# Patient Record
Sex: Female | Born: 1965 | Race: Black or African American | Hispanic: No | Marital: Single | Smoking: Never smoker
Health system: Southern US, Community
[De-identification: ages and names within clinical notes are randomized; demographics above are authoritative.]

## PROBLEM LIST (undated history)

## (undated) DIAGNOSIS — E890 Postprocedural hypothyroidism: Secondary | ICD-10-CM

## (undated) HISTORY — PX: CHOLECYSTECTOMY: SHX55

## (undated) HISTORY — DX: Postprocedural hypothyroidism: E89.0

## (undated) HISTORY — PX: TUBAL LIGATION: SHX77

## (undated) HISTORY — PX: APPENDECTOMY: SHX54

## (undated) HISTORY — PX: TONSILLECTOMY: SUR1361

---

## 2007-10-16 HISTORY — PX: CHOLECYSTECTOMY: SHX55

## 2017-10-11 ENCOUNTER — Other Ambulatory Visit: Payer: Self-pay | Admitting: Family Medicine

## 2017-10-11 ENCOUNTER — Other Ambulatory Visit: Payer: Self-pay

## 2017-10-11 DIAGNOSIS — M7989 Other specified soft tissue disorders: Secondary | ICD-10-CM

## 2019-04-23 ENCOUNTER — Other Ambulatory Visit: Payer: Self-pay | Admitting: Family Medicine

## 2019-04-23 DIAGNOSIS — M79605 Pain in left leg: Secondary | ICD-10-CM

## 2019-04-23 DIAGNOSIS — I83812 Varicose veins of left lower extremities with pain: Secondary | ICD-10-CM

## 2019-05-21 ENCOUNTER — Ambulatory Visit
Admission: RE | Admit: 2019-05-21 | Discharge: 2019-05-21 | Disposition: A | Discharge status: Home / Self Care | Payer: BC Managed Care – PPO | Source: Ambulatory Visit | Attending: Family Medicine | Admitting: Family Medicine

## 2019-05-21 DIAGNOSIS — I83812 Varicose veins of left lower extremities with pain: Secondary | ICD-10-CM

## 2019-05-21 NOTE — Consult Note (Signed)
Chief Complaint:  Left lower extremity pain, concern for venous insufficiency and varicosities  Referring Physician(s): [place]  History of Present Illness: Katelyn Montgomery is a 53 y.o. female female with recent left calf bruising and swelling with concern for varicosities.  She has had no prior treatments for varicose veins.  No history of chronic venous disease, skin lesions or ulcerations.  No prior DVT.  She does have a family history of varicose veins affecting her sister and mother.  She does not currently wear support stockings or prescription strength compression stockings.  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  The symptoms were exacerbated by recent move with heavy lifting.  She does get some alleviation with leg elevation.  She reports minor scattered spider veins.  She currently does not take any pain medicines.  No past medical history on file.    Allergies: Patient has no allergy information on record.  Medications: Prior to Admission medications   Not on File     No family history on file.  Social History   Socioeconomic History  . Marital status: Unknown    Spouse name: Not on file  . Number of children: Not on file  . Years of education: Not on file  . Highest education level: Not on file  Occupational History  . Not on file  Social Needs  . Financial resource strain: Not on file  . Food insecurity    Worry: Not on file    Inability: Not on file  . Transportation needs    Medical: Not on file    Non-medical: Not on file  Tobacco Use  . Smoking status: Not on file  Substance and Sexual Activity  . Alcohol use: Not on file  . Drug use: Not on file  . Sexual activity: Not on file  Lifestyle  . Physical activity    Days per week: Not on file    Minutes per session: Not on file  . Stress: Not on file  Relationships  . Social Herbalist on phone: Not on file    Gets together: Not on file     Attends religious service: Not on file    Active member of club or organization: Not on file    Attends meetings of clubs or organizations: Not on file    Relationship status: Not on file  Other Topics Concern  . Not on file  Social History Narrative  . Not on file     Review of Systems: A 12 point ROS discussed and pertinent positives are indicated in the HPI above.  All other systems are negative.  Review of Systems  Vital Signs: BP 127/87 (BP Location: Right Arm)   Pulse 71   Temp 98.4 F (36.9 C)   SpO2 99%   Physical Exam Constitutional:      General: She is not in acute distress.    Appearance: She is not ill-appearing.  Musculoskeletal: Normal range of motion.        General: No swelling, tenderness or deformity.     Left lower leg: No edema.     Comments: Left calf region demonstrates a few very minor superficial spider veins.  Similar spider veins in the thigh region anteriorly.  No significant varicosities upon inspection or palpation.  No peripheral edema.  Normal pedal pulses.  No signs of significant vascular disease.  Neurological:     Mental Status: She is alert.  Imaging: US Venous Img Lower Unilateral Left  Result Date: 05/21/2019 CLINICAL DATA:  Left lower extremity calf pain concern for venous insufficiency and varicosities EXAM: LEFT LOWER EXTREMITY VENOUS DOPPLER ULTRASOUND TECHNIQUE: Gray-scale sonography with graded compression, as well as color Doppler and duplex ultrasound were performed to evaluate the lower extremity deep venous systems from the level of the common femoral vein and including the common femoral, femoral, profunda femoral, popliteal and calf veins including the posterior tibial, peroneal and gastrocnemius veins when visible. The superficial great saphenous vein was also interrogated. Spectral Doppler was utilized to evaluate flow at rest and with distal augmentation maneuvers in the common femoral, femoral and popliteal veins.  COMPARISON:  None. FINDINGS: Contralateral Common Femoral Vein: Respiratory phasicity is normal and symmetric with the symptomatic side. No evidence of thrombus. Normal compressibility. Common Femoral Vein: No evidence of thrombus. Normal compressibility, respiratory phasicity and response to augmentation. Saphenofemoral Junction: No evidence of thrombus. Normal compressibility and flow on color Doppler imaging. Negative for venous insufficiency or reflux. Profunda Femoral Vein: No evidence of thrombus. Normal compressibility and flow on color Doppler imaging. Femoral Vein: No evidence of thrombus. Normal compressibility, respiratory phasicity and response to augmentation. Popliteal Vein: No evidence of thrombus. Normal compressibility, respiratory phasicity and response to augmentation. Calf Veins: No evidence of thrombus. Normal compressibility and flow on color Doppler imaging. Superficial Great Saphenous Vein: No evidence of thrombus. Normal compressibility. Negative for venous insufficiency or reflux. Small saphenous vein: Negative for venous insufficiency or reflux. Venous Reflux:  None. Other Findings:  None. IMPRESSION: Negative for significant saphenous venous insufficiency or reflux. No underlying sub surface lower extremity varicosities by ultrasound. Negative for DVT. Electronically Signed   By: Jerilynn Mages.  Logan Vegh M.D.   On: 05/21/2019 09:57   [place] Rad Eval And Mgmt  Result Date: 05/21/2019 Please refer to "Notes" to see consult details.   Labs:  CBC: No results for input(s): WBC, HGB, HCT, PLT in the last 8760 hours.  COAGS: No results for input(s): INR, APTT in the last 8760 hours.  BMP: No results for input(s): NA, K, CL, CO2, GLUCOSE, BUN, CALCIUM, CREATININE, GFRNONAA, GFRAA in the last 8760 hours.  Invalid input(s): CMP  LIVER FUNCTION TESTS: No results for input(s): BILITOT, AST, ALT, ALKPHOS, PROT, ALBUMIN in the last 8760 hours.   Assessment and Plan:  Improving left lower  extremity calf region pain with a few scattered spider veins.  Suspect recent mild musculoskeletal injury.  Ultrasound is negative for DVT or saphenous venous insufficiency.  No subsurface varicosities.  Findings were reviewed with the patient.  She understands she does not need any laser treatment, vein removal, or injection.  All questions were addressed.  Plan: Follow-up as needed for any developing significant varicosities.  Electronically Signed: Greggory Keen 05/21/2019, 10:08 AM   I spent a total of  30 Minutes   in face to face in clinical consultation, greater than 50% of which was counseling/coordinating care for this patient with left calf pain and concern for varicosities

## 2019-07-02 ENCOUNTER — Other Ambulatory Visit: Payer: Self-pay

## 2019-07-02 ENCOUNTER — Encounter: Payer: Self-pay | Admitting: Internal Medicine

## 2019-07-02 ENCOUNTER — Ambulatory Visit (INDEPENDENT_AMBULATORY_CARE_PROVIDER_SITE_OTHER): Payer: BC Managed Care – PPO | Admitting: Internal Medicine

## 2019-07-02 DIAGNOSIS — E213 Hyperparathyroidism, unspecified: Secondary | ICD-10-CM | POA: Diagnosis not present

## 2019-07-02 LAB — ALBUMIN: Albumin: 4.3 g/dL (ref 3.5–5.2)

## 2019-07-02 LAB — BASIC METABOLIC PANEL
BUN: 10 mg/dL (ref 6–23)
CO2: 31 mEq/L (ref 19–32)
Calcium: 11.4 mg/dL — ABNORMAL HIGH (ref 8.4–10.5)
Chloride: 107 mEq/L (ref 96–112)
Creatinine, Ser: 0.67 mg/dL (ref 0.40–1.20)
GFR: 110.39 mL/min (ref >60.00)
Glucose, Bld: 95 mg/dL (ref 70–99)
Potassium: 4.2 mEq/L (ref 3.5–5.1)
Sodium: 142 mEq/L (ref 135–145)

## 2019-07-02 LAB — VITAMIN D 25 HYDROXY (VIT D DEFICIENCY, FRACTURES): VITD: 24.07 ng/mL — ABNORMAL LOW (ref 30.00–100.00)

## 2019-07-02 NOTE — Progress Notes (Signed)
Name: Katelyn Montgomery  MRN/ DOB: [id number], 1966/03/31    Age/ Sex: 53 y.o., female    PCP: Marda Stalker, PA-C   Reason for Endocrinology Evaluation: Hypercalcemia      Date of Initial Endocrinology Evaluation: 07/02/2019     HPI: Katelyn Montgomery is a 53 y.o. 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  The patient presented for initial endocrinology clinic visit on 07/02/2019 for consultative assistance with her hypercalcemia   Katelyn Montgomery indicates that she was first diagnosed with hypercalcemia in 2020. Since that time, she does not experienced symptoms of constipation, polyuria, polydipsia, generalized weakness, diffuse muscle pains, significant memory impairment. She denies use of over the counter calcium (including supplements, Tums, Rolaids, or other calcium containing antacids), lithium, HCTZ, or  She is on  vitamin D supplements 1000 iu   She denies  history of kidney stones, kidney disease, liver disease, granulomatous disease. She denies osteoporosis or prior fractures. Daily dietary calcium intake: 1 servings a week  . She denies family history of osteoporosis, parathyroid disease.    Two aunts with thyroid disease   She works at the [employer]:  Past Medical History:  Past Medical History:  Diagnosis Date  . Postablative hypothyroidism     Past Surgical History: The histories are not reviewed yet. Please review them in the "History" navigator section and refresh this [place].   Social History:  reports that she has never smoked. She has never used smokeless tobacco. She reports that she does not drink alcohol or use drugs.  Family History: family history includes Aneurysm in her father; Lung cancer in her mother.   HOME MEDICATIONS: Allergies as of 07/02/2019      Reactions   Indomethacin Rash      Medication List       Accurate as of July 02, 2019  2:54 PM. If you have any  questions, ask your nurse or doctor.        levothyroxine 75 MCG tablet Commonly known as: SYNTHROID Take 75 mcg by mouth daily before breakfast.   Vitamin D-3 25 MCG (1000 UT) Caps Take by mouth.         REVIEW OF SYSTEMS: A comprehensive ROS was conducted with the patient and is negative except as per HPI and below:  Review of Systems  Constitutional: Positive for malaise/fatigue. Negative for weight loss.  HENT: Negative for congestion and sore throat.   Eyes: Negative for blurred vision.  Respiratory: Negative for cough and shortness of breath.   Cardiovascular: Negative for chest pain and palpitations.  Gastrointestinal: Negative for constipation and nausea.  Genitourinary: Negative for frequency.  Neurological: Negative for tingling and tremors.  Endo/Heme/Allergies: Negative for polydipsia.  Psychiatric/Behavioral: Negative for depression. The patient is not nervous/anxious.        OBJECTIVE:  VS: BP 118/78 (BP Location: Left Arm, Patient Position: Sitting, Cuff Size: Normal)   Pulse 67   Temp 98.3 F (36.8 C)   Ht '5\' 3"'  (1.6 m)   Wt 157 lb 6.4 oz (71.4 kg)   SpO2 97%   BMI 27.88 kg/m    Wt Readings from Last 3 Encounters:  07/02/19 157 lb 6.4 oz (71.4 kg)     EXAM: General: Pt appears well and is in NAD  Hydration: Well-hydrated with moist mucous membranes and good skin turgor  Eyes: External eye exam normal without stare, lid lag or exophthalmos.  EOM intact.  Ears, Nose, Throat: Hearing: Grossly intact bilaterally Dental: Good dentition  Throat: Clear without mass, erythema or exudate  Neck: General: Supple without adenopathy. Thyroid: Thyroid size normal.  No goiter or nodules appreciated. No thyroid bruit.  Lungs: Clear with good BS bilat with no rales, rhonchi, or wheezes  Heart: Auscultation: RRR.  Abdomen: Normoactive bowel sounds, soft, nontender, without masses or organomegaly palpable  Extremities:  BL LE: No pretibial edema normal ROM  and strength.  Skin: Hair: Texture and amount normal with gender appropriate distribution Skin Inspection: No rashes, acanthosis nigricans/skin tags. No lipohypertrophy Skin Palpation: Skin temperature, texture, and thickness normal to palpation  Neuro: Cranial nerves: II - XII grossly intact  Motor: Normal strength throughout DTRs: 2+ and symmetric in UE without delay in relaxation phase  Mental Status: Judgment, insight: Intact Orientation: Oriented to time, place, and person Mood and affect: No depression, anxiety, or agitation     DATA REVIEWED: Results for GRACIANNA, VINK (MRN [id number]) as of 07/03/2019 12:35  Ref. Range 07/02/2019 10:41 07/02/2019 10:41  Sodium Latest Ref Range: 135 - 145 mEq/L 142   Potassium Latest Ref Range: 3.5 - 5.1 mEq/L 4.2   Chloride Latest Ref Range: 96 - 112 mEq/L 107   CO2 Latest Ref Range: 19 - 32 mEq/L 31   Glucose Latest Ref Range: 70 - 99 mg/dL 95   BUN Latest Ref Range: 6 - 23 mg/dL 10   Creatinine Latest Ref Range: 0.40 - 1.20 mg/dL 0.67   Calcium Latest Ref Range: 8.6 - 10.4 mg/dL 11.4 (H) 11.1 (H)  Albumin Latest Ref Range: 3.5 - 5.2 g/dL 4.3   GFR Latest Ref Range: >60.00 mL/min 110.39   VITD Latest Ref Range: 30.00 - 100.00 ng/mL 24.07 (L)   PTH, Intact Latest Ref Range: 14 - 64 pg/mL  137 (H)    PTH 54 pg/ml  Ionized calcium 5.5 mg/dL (4.5-5.6)  FT4 1.0 Vitamin D 22.8 ng/mL BUN/Cr 8/0.8 K 4.0 Calcium 10.8 (corrected 10.59)  Alk Phos 107  ASSESSMENT/PLAN/RECOMMENDATIONS:   1. Hypercalcemia   We discussed differential of familial hypercalcinuric hypercalcemia ([place]) vs Primary Hyperparathyroidism (pHPT)  - It is important to differentiate between the two, as [place] does not cause any organ damage and does not require further follow up , on the other hand pHPT could cause end organ damage and would require further evaluation.   - Will proceed with 24-hr collections  Recommendations : Maintain proper hydration Avoid OTC calcium  Maintain 2-3 servings of calcium a day  Increase Vitamin D 2000 iu daily     F/u in 3 months    Signed electronically by: Mack Guise, MD  [facility]  [address] [place], [place] [address] Phone: [phone] FAX: [phone]   CC: Marda Stalker, PA-C [place] [address] Phone: [phone] Fax: [phone]   Return to Endocrinology clinic as below: Future Appointments  Date Time Provider [place]  10/01/2019 10:10 AM Lita Flynn, Melanie Crazier, MD [facility] None

## 2019-07-02 NOTE — Patient Instructions (Signed)
-   Maintain proper hydration  - Avoid over the counter calcium tablets - maintain  2-3 servings of calcium daily     24-Hour Urine Collection   You will be collecting your urine for a 24-hour period of time.  Your timer starts with your first urine of the morning (For example - If you first pee at [place], your timer will start at [place])  [place] away your first urine of the morning  Collect your urine every time you pee for the next 24 hours STOP your urine collection 24 hours after you started the collection (For example - You would stop at 9AM the day after you started)

## 2019-07-03 LAB — PTH, INTACT AND CALCIUM
Calcium: 11.1 mg/dL — ABNORMAL HIGH (ref 8.6–10.4)
PTH: 137 pg/mL — ABNORMAL HIGH (ref 14–64)

## 2019-07-06 ENCOUNTER — Other Ambulatory Visit: Payer: BC Managed Care – PPO

## 2019-07-07 ENCOUNTER — Telehealth: Payer: Self-pay | Admitting: Internal Medicine

## 2019-07-07 LAB — CALCIUM, URINE, 24 HOUR: Calcium, 24H Urine: 386 mg/24 h — ABNORMAL HIGH

## 2019-07-07 LAB — CREATININE, URINE, 24 HOUR: Creatinine, 24H Ur: 1.2 g/(24.h) (ref 0.50–2.15)

## 2019-07-07 NOTE — Telephone Encounter (Signed)
Left a message for a call back   Abby Jaralla Rumaldo Difatta, MD  [facility]  [facility] [address] [place], [place] [address] Phone: [phone] FAX: [phone]  

## 2019-07-08 ENCOUNTER — Encounter: Payer: Self-pay | Admitting: Internal Medicine

## 2019-07-08 NOTE — Telephone Encounter (Signed)
Pt returned your call and I attempted to read to her what is in her letter, pt stated that she does not understand what is causing what and what else other than surgery can be done. Pt would like a call tomorrow about 1:15 p.m. to discuss ,stated that she will be at work up until that time.Please advise.

## 2019-07-09 NOTE — Telephone Encounter (Signed)
lft vm to return call 

## 2019-07-10 NOTE — Telephone Encounter (Signed)
Spoke to pt and after our conversation she stated that she better understood. Pt was concerned about her high cholesterol and I suggested that she contact her PCP concerning that for now.

## 2019-09-29 ENCOUNTER — Other Ambulatory Visit: Payer: Self-pay

## 2019-10-01 ENCOUNTER — Ambulatory Visit: Payer: BC Managed Care – PPO | Admitting: Internal Medicine

## 2019-10-15 ENCOUNTER — Ambulatory Visit
Admission: RE | Admit: 2019-10-15 | Discharge: 2019-10-15 | Disposition: A | Discharge status: Home / Self Care | Payer: BC Managed Care – PPO | Source: Ambulatory Visit | Attending: Internal Medicine | Admitting: Internal Medicine

## 2019-10-15 ENCOUNTER — Other Ambulatory Visit: Payer: Self-pay

## 2019-10-15 ENCOUNTER — Ambulatory Visit (INDEPENDENT_AMBULATORY_CARE_PROVIDER_SITE_OTHER): Payer: BC Managed Care – PPO | Admitting: Internal Medicine

## 2019-10-15 ENCOUNTER — Encounter: Payer: Self-pay | Admitting: Internal Medicine

## 2019-10-15 DIAGNOSIS — E210 Primary hyperparathyroidism: Secondary | ICD-10-CM | POA: Insufficient documentation

## 2019-10-15 DIAGNOSIS — E213 Hyperparathyroidism, unspecified: Secondary | ICD-10-CM | POA: Diagnosis not present

## 2019-10-15 NOTE — Progress Notes (Signed)
Name: Katelyn Montgomery  MRN/ DOB: [id number], June 04, 1966    Age/ Sex: 53 y.o., female     PCP: Marda Stalker, PA-C   Reason for Endocrinology Evaluation: Hypercalcemia      Initial Endocrinology Clinic Visit: 07/02/2019    PATIENT IDENTIFIER: 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 She has followed with [facility] since 07/02/2019 for consultative assistance with management of her hypercalcemia   HISTORICAL SUMMARY: The patient was first diagnosed with hypercalcemia in 2020.   She has 2 aunts with thyroid disease.  Works at [employer]:    Today (10/15/2019):  Ms. Kohn is here for a follow up on hypercalcemia. She is c/o cough. She is on Vitamin D 1000 iu daily.   Denies polyuria but has been having frequency.   Denies constipation  No renal stones.     ROS:  As per HPI.   HISTORY:  Past Medical History:  Past Medical History:  Diagnosis Date  . Postablative hypothyroidism     Past Surgical History:   Social History:  reports that she has never smoked. She has never used smokeless tobacco. She reports that she does not drink alcohol or use drugs. Family History:  Family History  Problem Relation Age of Onset  . Lung cancer Mother   . Aneurysm Father      HOME MEDICATIONS: Allergies as of 10/15/2019      Reactions   Indomethacin Rash      Medication List       Accurate as of October 15, 2019 12:31 PM. If you have any questions, ask your nurse or doctor.        levothyroxine 75 MCG tablet Commonly known as: SYNTHROID Take 75 mcg by mouth daily before breakfast.   Vitamin D-3 25 MCG (1000 UT) Caps Take by mouth.         OBJECTIVE:   PHYSICAL EXAM: VS: BP 118/64 (BP Location: Right Arm, Patient Position: Sitting, Cuff Size: Large)   Pulse 82   Temp 98.2 F (36.8 C)   Ht 5\' 3"  (1.6 m)   Wt 159 lb (72.1 kg)   SpO2 98%    BMI 28.17 kg/m    EXAM: General: Pt appears well and is in NAD  Lungs: Clear with good BS bilat with no rales, rhonchi, or wheezes  Heart: Auscultation: RRR.  Abdomen: Normoactive bowel sounds, soft, nontender, without masses or organomegaly palpable  Extremities:  BL LE: No pretibial edema normal ROM and strength.  Neuro: Cranial nerves: II - XII grossly intact  Motor: Normal strength throughout DTRs: 2+ and symmetric in UE without delay in relaxation phase  Mental Status: Judgment, insight: Intact Orientation: Oriented to time, place, and person Mood and affect: No depression, anxiety, or agitation     DATA REVIEWED:   Results for MARYTZA, FUSON (MRN [id number]) as of 10/21/2019 10:32  Ref. Range 10/15/2019 11:13  Sodium Latest Ref Range: 135 - 146 mmol/L 143  Potassium Latest Ref Range: 3.5 - 5.3 mmol/L 3.9  Chloride Latest Ref Range: 98 - 110 mmol/L 108  CO2 Latest Ref Range: 20 - 32 mmol/L 26  Glucose Latest Ref Range: 65 - 99 mg/dL 109 (H)  BUN Latest Ref Range: 7 - 25 mg/dL 11  Creatinine Latest Ref Range: 0.50 - 1.05 mg/dL 0.70  Calcium Latest Ref Range: 8.6 - 10.4 mg/dL 10.8 (H)  BUN/Creatinine Ratio Latest Ref  Range: 6 - 22 (calc) NOT APPLICABLE  Vitamin D, 99991111 Latest Ref Range: 30 - 100 ng/mL 24 (L)  PTH, Intact Latest Ref Range: 14 - 64 pg/mL 53  Albumin MSPROF Latest Ref Range: 3.6 - 5.1 g/dL 4.1    ASSESSMENT / PLAN / RECOMMENDATIONS:   1. Primary Hyperparathyroidism:   - Pt asymptomatic  - 24-urine collection of calcium at 386 (06/2019) - Will proceed with DXA - KUB shows a left 1.8 cm pelvic radio-opaque area, can not exclude bladder stone - Repeat labs show improving calcium level with normal PTH, which is not unusual .  Medications  Vitamin D3 2000 iu daily Encouraged hydration Continue to avoid OTC calcium Maintain 2-3 servings of calcium daily    2. Abnormal X-ray   - Unclear what's the radio-opaque area in the left pelvis -  Discussed the case with the reading radiologist, recommended either proceeding with CT or ultrasound. BUt since the ultrasound may be limited will proceed with CT pelvis without contrast   Discussed abnormal xray and labs with the pt on 1/4th   Left a message for the pt on 10/19/18 at 1600 to discuss CT scan- awaiting call back   F/U 4 months   Signed electronically by: Mack Guise, MD  [facility]  [address] [facility] [address] [place], [place] [address] Phone: [phone] FAX: [phone]      CC: Marda Stalker, PA-C [place] [address] Phone: [phone]  Fax: [phone]   Return to Endocrinology clinic as below: Future Appointments  Date Time Provider [place]  10/21/2019  9:30 AM [facility] DEXA 1 [facility]  02/15/2020 10:30 AM Shamleffer, Melanie Crazier, MD [facility] None

## 2019-10-19 LAB — BASIC METABOLIC PANEL
BUN: 11 mg/dL (ref 7–25)
CO2: 26 mmol/L (ref 20–32)
Calcium: 10.8 mg/dL — ABNORMAL HIGH (ref 8.6–10.4)
Chloride: 108 mmol/L (ref 98–110)
Creat: 0.7 mg/dL (ref 0.50–1.05)
Glucose, Bld: 109 mg/dL — ABNORMAL HIGH (ref 65–99)
Potassium: 3.9 mmol/L (ref 3.5–5.3)
Sodium: 143 mmol/L (ref 135–146)

## 2019-10-19 LAB — ALBUMIN: Albumin: 4.1 g/dL (ref 3.6–5.1)

## 2019-10-19 LAB — VITAMIN D 25 HYDROXY (VIT D DEFICIENCY, FRACTURES): Vit D, 25-Hydroxy: 24 ng/mL — ABNORMAL LOW (ref 30–100)

## 2019-10-19 LAB — PARATHYROID HORMONE, INTACT (NO CA): PTH: 53 pg/mL (ref 14–64)

## 2019-10-20 ENCOUNTER — Telehealth: Payer: Self-pay | Admitting: Internal Medicine

## 2019-10-20 NOTE — Telephone Encounter (Signed)
Left a message for a call back 10/19/2018 at 1600

## 2019-10-21 ENCOUNTER — Encounter: Payer: Self-pay | Admitting: Internal Medicine

## 2019-10-21 ENCOUNTER — Inpatient Hospital Stay: Admission: RE | Admit: 2019-10-21 | Payer: BC Managed Care – PPO | Source: Ambulatory Visit

## 2019-10-22 ENCOUNTER — Other Ambulatory Visit: Payer: Self-pay

## 2019-10-22 ENCOUNTER — Ambulatory Visit (INDEPENDENT_AMBULATORY_CARE_PROVIDER_SITE_OTHER)
Admission: RE | Admit: 2019-10-22 | Discharge: 2019-10-22 | Disposition: A | Discharge status: Home / Self Care | Payer: BC Managed Care – PPO | Source: Ambulatory Visit | Attending: Internal Medicine | Admitting: Internal Medicine

## 2019-10-23 ENCOUNTER — Encounter: Payer: Self-pay | Admitting: Internal Medicine

## 2019-10-29 ENCOUNTER — Telehealth: Payer: Self-pay

## 2019-10-29 NOTE — Telephone Encounter (Signed)
Spoke to pt who had further questions about recent results

## 2019-10-29 NOTE — Telephone Encounter (Signed)
Patient called in wanting the results to her Bone Density test I did tell her she had a letter mailed to her on the 10/23/2019 she stated that was for something different     Please call and advise

## 2019-11-05 ENCOUNTER — Ambulatory Visit
Admission: RE | Admit: 2019-11-05 | Discharge: 2019-11-05 | Disposition: A | Discharge status: Home / Self Care | Payer: BC Managed Care – PPO | Source: Ambulatory Visit | Attending: Internal Medicine | Admitting: Internal Medicine

## 2019-11-05 DIAGNOSIS — E210 Primary hyperparathyroidism: Secondary | ICD-10-CM

## 2019-11-05 DIAGNOSIS — E213 Hyperparathyroidism, unspecified: Secondary | ICD-10-CM

## 2019-11-06 ENCOUNTER — Encounter: Payer: Self-pay | Admitting: Internal Medicine

## 2019-11-06 ENCOUNTER — Telehealth: Payer: Self-pay | Admitting: Internal Medicine

## 2019-11-06 NOTE — Telephone Encounter (Signed)
Discussed CT scan results with Ms Barton on 11/06/2019 at 11 am.    CT scan  Lower chest: No acute abnormality.  Hepatobiliary: No focal liver abnormality is seen. Status post cholecystectomy. No biliary dilatation.  Pancreas: Unremarkable. No pancreatic ductal dilatation or surrounding inflammatory changes.  Spleen: Normal in size without focal abnormality.  Adrenals/Urinary Tract: Adrenal glands are unremarkable. Kidneys are normal, without renal calculi, focal lesion, or hydronephrosis. Bladder is unremarkable.  Stomach/Bowel: Stomach is within normal limits. The appendix is not identified. No evidence of bowel wall thickening, distention, or inflammatory changes.  Vascular/Lymphatic: Mild aortic atherosclerosis. No enlarged abdominal or pelvic lymph nodes.  Reproductive: A 4.6 cm x 3.7 cm heterogeneous, noncalcified soft tissue mass is seen within the lateral aspect of the uterine fundus on the right (axial CT images 68 through 76, CT series number 2). The bilateral adnexa are unremarkable.  Other: No abdominal wall hernia or abnormality. No abdominopelvic ascites.  Musculoskeletal: Multilevel degenerative changes seen throughout the lumbar spine.  IMPRESSION: 1. Evidence of prior cholecystectomy. 2. Findings likely consistent with a noncalcified uterine fibroid. 3. No abnormal findings are seen within the left hemipelvis to correspond to the area of opacification seen within this region on the prior abdomen and pelvis plain film, dated October 15, 2019.    Discussed results with the patient , I offered to refer her to [facility] of her choice, but she would like to discuss this with her PCP.   She denies any pain or vaginal bleed .   Attempted to sent this message through Epic to PCP but unsuccessful. Will notify the pt with a letter     Abby Nena Jordan, MD  [facility]  [facility] [address] [place], [place] [address] Phone: [phone] FAX: [phone]

## 2019-12-09 ENCOUNTER — Encounter (HOSPITAL_BASED_OUTPATIENT_CLINIC_OR_DEPARTMENT_OTHER): Payer: Self-pay

## 2019-12-09 ENCOUNTER — Emergency Department (HOSPITAL_BASED_OUTPATIENT_CLINIC_OR_DEPARTMENT_OTHER): Payer: BC Managed Care – PPO

## 2019-12-09 ENCOUNTER — Emergency Department (HOSPITAL_BASED_OUTPATIENT_CLINIC_OR_DEPARTMENT_OTHER)
Admission: EM | Admit: 2019-12-09 | Discharge: 2019-12-09 | Disposition: A | Discharge status: Home / Self Care | Payer: BC Managed Care – PPO | Attending: Emergency Medicine | Admitting: Emergency Medicine

## 2019-12-09 ENCOUNTER — Other Ambulatory Visit: Payer: Self-pay

## 2019-12-09 DIAGNOSIS — R519 Headache, unspecified: Secondary | ICD-10-CM | POA: Insufficient documentation

## 2019-12-09 DIAGNOSIS — Z79899 Other long term (current) drug therapy: Secondary | ICD-10-CM | POA: Diagnosis not present

## 2019-12-09 DIAGNOSIS — E890 Postprocedural hypothyroidism: Secondary | ICD-10-CM | POA: Diagnosis not present

## 2019-12-09 DIAGNOSIS — M791 Myalgia, unspecified site: Secondary | ICD-10-CM | POA: Diagnosis not present

## 2019-12-09 MED ORDER — CYCLOBENZAPRINE HCL 10 MG PO TABS: 10.0000 mg | ORAL_TABLET | Freq: Two times a day (BID) | ORAL | 0 refills | Status: DC | PRN

## 2019-12-09 MED ORDER — ACETAMINOPHEN 500 MG PO TABS: 500.0000 mg | ORAL_TABLET | Freq: Four times a day (QID) | ORAL | 0 refills | Status: AC | PRN
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
## 2019-12-09 NOTE — Discharge Instructions (Addendum)
Please take your medications, as prescribed.  You were given a prescription for Flexeril which is a muscle relaxer.  You should not drive, work, consume alcohol, or operate machinery while taking this medication as it can make you very drowsy.    Your work-up today was reassuring.  Please follow-up with your primary care provider regarding today's encounter.  You need to receive concussion clearance prior to starting contact sports.  Please return to the ED or seek immediate medical attention should you develop any new or worsening symptoms.
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
## 2020-02-10 ENCOUNTER — Other Ambulatory Visit: Payer: Self-pay

## 2020-02-12 ENCOUNTER — Ambulatory Visit: Payer: BC Managed Care – PPO | Admitting: Internal Medicine

## 2020-02-15 ENCOUNTER — Ambulatory Visit: Payer: BC Managed Care – PPO | Admitting: Internal Medicine

## 2020-02-18 ENCOUNTER — Other Ambulatory Visit: Payer: Self-pay

## 2020-02-18 ENCOUNTER — Encounter: Payer: Self-pay | Admitting: Internal Medicine

## 2020-02-18 ENCOUNTER — Ambulatory Visit (INDEPENDENT_AMBULATORY_CARE_PROVIDER_SITE_OTHER): Payer: BC Managed Care – PPO | Admitting: Internal Medicine

## 2020-02-18 VITALS — BP 122/72 | HR 83 | Temp 98.4°F | Ht 63.0 in | Wt 160.8 lb

## 2020-02-18 DIAGNOSIS — E210 Primary hyperparathyroidism: Secondary | ICD-10-CM | POA: Diagnosis not present
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
## 2020-02-25 ENCOUNTER — Other Ambulatory Visit (INDEPENDENT_AMBULATORY_CARE_PROVIDER_SITE_OTHER): Payer: BC Managed Care – PPO

## 2020-02-25 ENCOUNTER — Other Ambulatory Visit: Payer: Self-pay

## 2020-02-25 DIAGNOSIS — E210 Primary hyperparathyroidism: Secondary | ICD-10-CM

## 2020-02-25 LAB — BASIC METABOLIC PANEL
BUN: 10 mg/dL (ref 6–23)
CO2: 29 mEq/L (ref 19–32)
Calcium: 10.7 mg/dL — ABNORMAL HIGH (ref 8.4–10.5)
Chloride: 107 mEq/L (ref 96–112)
Creatinine, Ser: 0.68 mg/dL (ref 0.40–1.20)
GFR: 108.26 mL/min (ref >60.00)
Glucose, Bld: 127 mg/dL — ABNORMAL HIGH (ref 70–99)
Potassium: 3.8 mEq/L (ref 3.5–5.1)
Sodium: 139 mEq/L (ref 135–145)

## 2020-02-25 LAB — VITAMIN D 25 HYDROXY (VIT D DEFICIENCY, FRACTURES): VITD: 19.32 ng/mL — ABNORMAL LOW (ref 30.00–100.00)

## 2020-02-25 LAB — ALBUMIN: Albumin: 4 g/dL (ref 3.5–5.2)

## 2020-02-26 LAB — PARATHYROID HORMONE, INTACT (NO CA): PTH: 61 pg/mL (ref 14–64)

## 2020-03-03 ENCOUNTER — Telehealth: Payer: Self-pay | Admitting: Internal Medicine

## 2020-03-03 NOTE — Telephone Encounter (Signed)
Informed pt that her MyChart is active so she can log-in to that for future results. Went over results with her and mailed copy.

## 2020-03-03 NOTE — Telephone Encounter (Signed)
Patient requests to be called at ph# [phone] to be given lab results

## 2020-08-25 ENCOUNTER — Ambulatory Visit (INDEPENDENT_AMBULATORY_CARE_PROVIDER_SITE_OTHER): Payer: BC Managed Care – PPO | Admitting: Internal Medicine

## 2020-08-25 ENCOUNTER — Other Ambulatory Visit: Payer: Self-pay

## 2020-08-25 ENCOUNTER — Encounter: Payer: Self-pay | Admitting: Internal Medicine

## 2020-08-25 VITALS — BP 118/70 | HR 76 | Ht 63.0 in | Wt 155.1 lb

## 2020-08-25 DIAGNOSIS — E210 Primary hyperparathyroidism: Secondary | ICD-10-CM

## 2020-08-25 DIAGNOSIS — E890 Postprocedural hypothyroidism: Secondary | ICD-10-CM | POA: Diagnosis not present

## 2020-08-25 LAB — BASIC METABOLIC PANEL
BUN: 10 mg/dL (ref 6–23)
CO2: 31 mEq/L (ref 19–32)
Calcium: 10.9 mg/dL — ABNORMAL HIGH (ref 8.4–10.5)
Chloride: 108 mEq/L (ref 96–112)
Creatinine, Ser: 0.73 mg/dL (ref 0.40–1.20)
GFR: 91.82 mL/min (ref >60.00)
Glucose, Bld: 95 mg/dL (ref 70–99)
Potassium: 4.3 mEq/L (ref 3.5–5.1)
Sodium: 142 mEq/L (ref 135–145)

## 2020-08-25 LAB — VITAMIN D 25 HYDROXY (VIT D DEFICIENCY, FRACTURES): VITD: 30.9 ng/mL (ref 30.00–100.00)

## 2020-08-25 LAB — ALBUMIN: Albumin: 4.1 g/dL (ref 3.5–5.2)

## 2020-08-25 LAB — TSH: TSH: 1.18 u[IU]/mL (ref 0.35–4.50)

## 2020-08-25 NOTE — Patient Instructions (Signed)

## 2020-08-25 NOTE — Progress Notes (Signed)
Name: Katelyn Montgomery  MRN/ DOB: [id number], May 25, 1966    Age/ Sex: 54 y.o., female     PCP: Marda Stalker, PA-C   Reason for Endocrinology Evaluation: Hypercalcemia      Initial Endocrinology Clinic Visit: 07/02/2019    PATIENT IDENTIFIER: [name] is a 54 y.o., female with a past medical history of postablative hypothyroidism > 20 yrs ago . She has followed with [facility] since 07/02/2019 for consultative assistance with management of her hypercalcemia   HISTORICAL SUMMARY: The patient was first diagnosed with hypercalcemia in 2020.   DXA 10/2019 low bone density  KUB: no renal stones or nephrocalcinosis  24 hr urine calcium 386      THYROID HISTORY: She is S/P RAI ablation secondary to hyperthyroidism at the age of 59. She has been on LT-4 replacement since then.     She has 2 aunts with thyroid disease.    SUBJECTIVE:    Today (08/25/2020):  Ms. [name] is here for a follow up on hypercalcemia/ primary hyperparathyroidism   Has noted polyuria and polydipsia Denies constipation  No renal stones  She is on Vitamin D 2000 iu BID   Has been having dry cough  Has occasional mild heartburn    HISTORY:  Past Medical History:  Past Medical History:  Diagnosis Date  . Postablative hypothyroidism     Past Surgical History:   Social History:  reports that she has never smoked. She has never used smokeless tobacco. She reports that she does not drink alcohol and does not use drugs. Family History:  Family History  Problem Relation Age of Onset  . Lung cancer Mother   . Aneurysm Father      HOME MEDICATIONS: Allergies as of 08/25/2020      Reactions   Erythromycin Rash   Indomethacin Rash      Medication List       Accurate as of August 25, 2020  9:47 AM. If you have any questions, ask your nurse or doctor.        acetaminophen 500 MG tablet Commonly known as: TYLENOL Take 1 tablet (500 mg total) by mouth  every 6 (six) hours as needed.   cyclobenzaprine 10 MG tablet Commonly known as: FLEXERIL Take 1 tablet (10 mg total) by mouth 2 (two) times daily as needed for muscle spasms.   D3 VITAMIN PO Take 4,000 Units by mouth. What changed: Another medication with the same name was removed. Continue taking this medication, and follow the directions you see here. Changed by: Dorita Sciara, MD   levothyroxine 75 MCG tablet Commonly known as: SYNTHROID Take 75 mcg by mouth daily before breakfast.         OBJECTIVE:   PHYSICAL EXAM: VS: BP 118/70   Pulse 76   Ht 5\' 3"  (1.6 m)   Wt 155 lb 2 oz (70.4 kg)   SpO2 97%   BMI 27.48 kg/m    EXAM: General: Pt appears well and is in NAD  Lungs: Clear with good BS bilat with no rales, rhonchi, or wheezes  Heart: Auscultation: RRR.  Abdomen: Normoactive bowel sounds, soft, nontender, without masses or organomegaly palpable  Extremities:  BL LE: No pretibial edema normal ROM and strength.  Neuro: Cranial nerves: II - XII grossly intact  Motor: Normal strength throughout DTRs: 2+ and symmetric in UE without delay in relaxation phase  Mental Status: Judgment, insight: Intact Orientation: Oriented to time, place, and person Mood and affect: No depression,  anxiety, or agitation     DATA REVIEWED:  Results for DANIELLY, ACKERLEY (MRN [id number]) as of 08/28/2020 07:35  Ref. Range 08/25/2020 10:14  Sodium Latest Ref Range: 135 - 145 mEq/L 142  Potassium Latest Ref Range: 3.5 - 5.1 mEq/L 4.3  Chloride Latest Ref Range: 96 - 112 mEq/L 108  CO2 Latest Ref Range: 19 - 32 mEq/L 31  Glucose Latest Ref Range: 70 - 99 mg/dL 95  BUN Latest Ref Range: 6 - 23 mg/dL 10  Creatinine Latest Ref Range: 0.40 - 1.20 mg/dL 0.73  Calcium Latest Ref Range: 8.4 - 10.5 mg/dL 10.9 (H)  Albumin Latest Ref Range: 3.5 - 5.2 g/dL 4.1  GFR Latest Ref Range: >60.00 mL/min 91.82  VITD Latest Ref Range: 30.00 - 100.00 ng/mL 30.90  PTH, Intact Latest Ref Range:  14 - 64 pg/mL 96 (H)  TSH Latest Ref Range: 0.35 - 4.50 uIU/mL 1.18      ASSESSMENT / PLAN / RECOMMENDATIONS:   1. Primary Hyperparathyroidism:   - Pt asymptomatic  - 24-urine collection of calcium at 386 (06/2019)- Will repeat this year  - DXA showed low bone density  - KUB : No evidence of stones or nephrocalcinosis - Repeat labs show stable serum calcium and elevated PTH  - If 24-hr urinary excretion of calcium continues to be > 30 mg, pt will be referred for parathyroidectomy      Medications  Vitamin D3 2000 iu daily Encouraged hydration Continue to avoid OTC calcium Maintain 2-3 servings of calcium daily   2. Postablative Hypothyroidism:    - Pt is clinically euthyroid  - No local neck symptoms  - TSh is normal   Medication  Levothyroxine 75 mcg daily      F/U 6 months   Signed electronically by: Mack Guise, MD  [facility] Endocrinology  [facility] [address] [place], [place] [address] Phone: [phone] FAX: [phone]      CC: Marda Stalker, PA-C [place] [address] Phone: [phone]  Fax: [phone]   Return to Endocrinology clinic as below: No future appointments.

## 2020-08-26 LAB — PARATHYROID HORMONE, INTACT (NO CA): PTH: 96 pg/mL — ABNORMAL HIGH (ref 14–64)

## 2020-08-30 ENCOUNTER — Telehealth: Payer: Self-pay

## 2020-08-30 ENCOUNTER — Other Ambulatory Visit: Payer: BC Managed Care – PPO

## 2020-08-30 ENCOUNTER — Other Ambulatory Visit: Payer: Self-pay

## 2020-08-30 DIAGNOSIS — E210 Primary hyperparathyroidism: Secondary | ICD-10-CM

## 2020-08-30 NOTE — Telephone Encounter (Signed)
Tried to call patient but could not leave message since voicemail is not set up

## 2020-08-30 NOTE — Telephone Encounter (Signed)
New message    Patient is asking for a call back on tomorrow before 11 am.   --- test results & additional questions

## 2020-08-31 ENCOUNTER — Telehealth: Payer: Self-pay | Admitting: Internal Medicine

## 2020-08-31 LAB — CREATININE, URINE, 24 HOUR: Creatinine, 24H Ur: 1.18 g/(24.h) (ref 0.50–2.15)

## 2020-08-31 LAB — CALCIUM, URINE, 24 HOUR: Calcium, 24H Urine: 584 mg/24 h — ABNORMAL HIGH

## 2020-08-31 NOTE — Telephone Encounter (Signed)
Attempted to call the pt to discuss elevated urinary calcium. There was no answer and the voice mail has not been set up   A portal message will be sent   Abby Nena Jordan, MD  [facility] Endocrinology  [facility] [address] [place], [place] [address] Phone: [phone] FAX: [phone]

## 2020-08-31 NOTE — Telephone Encounter (Signed)
Spoke to the pt. She is in agreements for surgical referral. An order has been placed

## 2020-08-31 NOTE — Telephone Encounter (Signed)
Patient called back inform patient as follows:  Hi Katelyn Montgomery,   I just tried to call you but there was no voice mail set up for me to leave a message  Your calcium is high in the urine, which means surgery is recommended and I would like to refer you to a surgeon.   Please let me know if this is ok with you ?  Patient stated is it possible for Dr Kelton Pillar to give her a call again Thursday after she done with patients.

## 2020-09-17 ENCOUNTER — Telehealth: Payer: Self-pay | Admitting: Infectious Diseases

## 2020-09-17 NOTE — Telephone Encounter (Signed)
Called to Discuss with patient about Covid symptoms and the use of the monoclonal antibody infusion for those with mild to moderate Covid symptoms and at a high risk of hospitalization.     Pt appears to qualify for this infusion due to co-morbid conditions and/or a member of an at-risk group in accordance with the FDA Emergency Use Authorization.    Patient called monoclonal Ab infusion voicemail for self referral for treatment consideration.   Unable to reach her - unable to LVM to call back. MyChart note sent.   Unclear as to sx onset.  SVI risk score 3 which would qualify her for infusion. No covid vaccines on file.     Janene Madeira, MSN, NP-C [facility] for [email or web address] Disease [email or web address] Pager: [phone] Office: [phone] [place]: [phone]

## 2020-10-25 ENCOUNTER — Other Ambulatory Visit (HOSPITAL_COMMUNITY): Payer: Self-pay | Admitting: Surgery

## 2020-10-25 DIAGNOSIS — E210 Primary hyperparathyroidism: Secondary | ICD-10-CM

## 2020-11-07 ENCOUNTER — Encounter (HOSPITAL_COMMUNITY)
Admission: RE | Admit: 2020-11-07 | Discharge: 2020-11-07 | Disposition: A | Discharge status: Home / Self Care | Payer: BC Managed Care – PPO | Source: Ambulatory Visit | Attending: Surgery | Admitting: Surgery

## 2020-11-07 ENCOUNTER — Other Ambulatory Visit: Payer: Self-pay

## 2020-11-07 ENCOUNTER — Ambulatory Visit (HOSPITAL_COMMUNITY)
Admission: RE | Admit: 2020-11-07 | Discharge: 2020-11-07 | Disposition: A | Discharge status: Home / Self Care | Payer: BC Managed Care – PPO | Source: Ambulatory Visit | Attending: Surgery | Admitting: Surgery

## 2020-11-07 DIAGNOSIS — E210 Primary hyperparathyroidism: Secondary | ICD-10-CM | POA: Insufficient documentation

## 2020-11-07 MED ORDER — TECHNETIUM TC 99M SESTAMIBI - CARDIOLITE
25.8000 | Freq: Once | INTRAVENOUS | Status: AC | PRN
  Administered 2020-11-07: 25.8 via INTRAVENOUS

## 2020-11-10 ENCOUNTER — Other Ambulatory Visit: Payer: Self-pay | Admitting: Surgery

## 2020-11-10 DIAGNOSIS — E210 Primary hyperparathyroidism: Secondary | ICD-10-CM

## 2020-11-25 ENCOUNTER — Other Ambulatory Visit: Payer: Self-pay

## 2020-11-25 ENCOUNTER — Ambulatory Visit
Admission: RE | Admit: 2020-11-25 | Discharge: 2020-11-25 | Disposition: A | Discharge status: Home / Self Care | Payer: BC Managed Care – PPO | Source: Ambulatory Visit | Attending: Surgery | Admitting: Surgery

## 2020-11-25 DIAGNOSIS — E210 Primary hyperparathyroidism: Secondary | ICD-10-CM

## 2020-11-25 MED ORDER — IOPAMIDOL (ISOVUE-300) INJECTION 61%
100.0000 mL | Freq: Once | INTRAVENOUS | Status: AC | PRN
  Administered 2020-11-25: 100 mL via INTRAVENOUS

## 2020-12-05 ENCOUNTER — Ambulatory Visit: Payer: Self-pay | Admitting: Surgery

## 2020-12-05 NOTE — H&P (Signed)
General Surgery [facility]  Giuliana Handyside DOB: [birth year] Single / Language: Cleophus Molt / Race: Refused to Report/Unreported Female   History of Present Illness  The patient is a 55 year old female who presents with primary hyperparathyroidism.  CHIEF COMPLAINT: primary hyperparathyroidism  Patient is referred by Dr. Vivia Ewing for surgical evaluation and management of newly diagnosed primary hyperparathyroidism. Patient's primary care provider is Marda Stalker. Patient has a history of hyperthyroidism treated with radioactive iodine ablation about 27 years ago in [place]. She has been on thyroid hormone replacement since that time. Patient was noted on routine laboratory studies to have elevated serum calcium levels approximately 1-2 years ago. Repeated levels remained elevated. Most recent levels have ranged from 10.7-10.9. Patient intact PTH level was elevated at 96. 24-hour urine collection for calcium was markedly elevated at 584. 25-hydroxy vitamin D level was 30. Patient has not experienced any significant complications. She has not had a bone density scan. She denies any recent fractures. She denies bone or joint pain. She denies nephrolithiasis. She denies fatigue. Patient has not had any further imaging studies performed. 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   Allergies  Erythromycin *DERMATOLOGICALS*  Allergies Reconciled   Medication History  Tylenol (500MG  Capsule, 1 (one) Oral) Active. Synthroid (75MCG Tablet, Oral) Active. Cholecalciferol (Oral) Specific strength unknown - Active. Medications Reconciled  Vitals  Weight: 153.25 lb Height: 63in Body Surface Area: 1.73 m Body Mass Index: 27.15 kg/m  Temp.: 97.75F  Pulse: 108 (Regular)  P.OX: 97% (Room air) BP: 120/80(Sitting, Left Arm, Standard)  Physical Exam   GENERAL APPEARANCE Development: normal Nutritional status: normal Gross deformities:  none  SKIN Rash, lesions, ulcers: none Induration, erythema: none Nodules: none palpable  EYES Conjunctiva and lids: normal Pupils: equal and reactive Iris: normal bilaterally  EARS, NOSE, MOUTH, THROAT External ears: no lesion or deformity External nose: no lesion or deformity Hearing: grossly normal Due to Covid-19 pandemic, patient is wearing a mask.  NECK Symmetric: yes Trachea: midline Thyroid: no palpable nodules in the thyroid bed  CHEST Respiratory effort: normal Retraction or accessory muscle use: no Breath sounds: normal bilaterally Rales, rhonchi, wheeze: none  CARDIOVASCULAR Auscultation: regular rhythm, normal rate Murmurs: none Pulses: radial pulse 2+ palpable Lower extremity edema: none  MUSCULOSKELETAL Station and gait: normal Digits and nails: no clubbing or cyanosis Muscle strength: grossly normal all extremities Range of motion: grossly normal all extremities Deformity: none  LYMPHATIC Cervical: none palpable Supraclavicular: none palpable  PSYCHIATRIC Oriented to person, place, and time: yes Mood and affect: normal for situation Judgment and insight: appropriate for situation    Assessment & Plan  PRIMARY HYPERPARATHYROIDISM (E21.0)  Follow Up - Call [facility] office after tests / studies doneto discuss further plans  Patient is referred by her endocrinologist for surgical evaluation and management of primary hyperparathyroidism.  Patient provided with a copy of "Parathyroid Surgery: Treatment for Your Parathyroid Gland Problem", published by [employer], 12 pages. Book reviewed and explained to patient during visit today.  Patient has biochemical evidence of primary hyperparathyroidism. We discussed proceeding with further imaging studies in order to confirm the diagnosis and to localize the site of the parathyroid adenoma. If this is successful, then she will be a good candidate for minimally invasive surgery. We will request a nuclear  medicine parathyroid scan as well as an ultrasound examination of the neck. The studies are about 80% successful in identifying the location of the adenoma. If they fail to identify the adenoma, then  we will proceed with a 4D CT scan of the neck. We discussed minimally invasive parathyroid surgery. We discussed the location of the surgical incision. We discussed the hospital stay to be anticipated. We discussed potential complications. The patient understands and agrees to proceed with further testing.  Patient will undergo the above studies. We will contact her with those results and then make a decision on how to proceed.  ADDENDUM  Telephone call to patient regarding 4D-CT results. Positive for left inferior parathyroid adenoma corresponding to the nodule seen on the USN exam.  Recommend proceeding with minimally invasive parathyroidectomy as an out-patient procedure.  Attempted to call patient at number provided - no answer and voice mail is not set up.  Will try to contact later today. Will submit orders to schedulers.  Armandina Gemma, [place] [facility] Office: [phone]

## 2021-01-16 ENCOUNTER — Telehealth: Payer: Self-pay | Admitting: Internal Medicine

## 2021-01-16 NOTE — Telephone Encounter (Signed)
Pt called to see if prior to her parathyroid surgery in June if she should discontinue taking her vitamin D medication?   Pt requests a call at ph# [phone] to let her know what to do.

## 2021-01-16 NOTE — Telephone Encounter (Signed)
Needs to continue vitamin D. Needs a follow up with me 6 weeks post-op

## 2021-01-16 NOTE — Telephone Encounter (Signed)
Spoken to patient and notified Dr Shamleffer's comments. Verbalized understanding.   

## 2021-02-23 ENCOUNTER — Ambulatory Visit: Payer: BC Managed Care – PPO | Admitting: Internal Medicine

## 2021-03-08 NOTE — Patient Instructions (Signed)
DUE TO COVID-19 ONLY ONE VISITOR IS ALLOWED TO COME WITH YOU AND STAY IN THE WAITING ROOM ONLY DURING PRE OP AND PROCEDURE DAY OF SURGERY. THE 1 VISITOR  MAY VISIT WITH YOU AFTER SURGERY IN YOUR PRIVATE ROOM DURING VISITING HOURS ONLY!                Katelyn Montgomery    Your procedure is scheduled on: 6/6/ 22   Report to [facility]  Entrance   Report to Short stay at 5:15 AM     Call this number if you have problems the morning of surgery [phone]    Remember: Do not eat food after Midnight.  You may have clear liquids until 4:30 AM.   . BRUSH YOUR TEETH MORNING OF SURGERY AND RINSE YOUR MOUTH OUT, NO CHEWING GUM CANDY OR MINTS.     Take these medicines the morning of surgery with A SIP OF WATER: Levothyroxine                                You may not have any metal on your body including hair pins and              piercings  Do not wear jewelry, make-up, lotions, powders or perfumes, deodorant             Do not wear nail polish on your fingernails.  Do not shave  48 hours prior to surgery.     Do not bring valuables to the hospital. [place].  Contacts, dentures or bridgework may not be worn into surgery.      Patients discharged the day of surgery will not be allowed to drive home.   IF YOU ARE HAVING SURGERY AND GOING HOME THE SAME DAY, YOU MUST HAVE AN ADULT TO DRIVE YOU HOME AND BE WITH YOU FOR 24 HOURS. YOU MAY GO HOME BY TAXI OR UBER OR ORTHERWISE, BUT AN ADULT MUST ACCOMPANY YOU HOME AND STAY WITH YOU FOR 24 HOURS.  Name and phone number of your driver:  Special Instructions: N/A              Please read over the following fact sheets you were given: _____________________________________________________________________             [facility] - Preparing for Surgery Before surgery, you can play an important role.  Because skin is not sterile, your skin needs to be as free of germs as  possible.  You can reduce the number of germs on your skin by washing with CHG (chlorahexidine gluconate) soap before surgery.  CHG is an antiseptic cleaner which kills germs and bonds with the skin to continue killing germs even after washing. Please DO NOT use if you have an allergy to CHG or antibacterial soaps.  If your skin becomes reddened/irritated stop using the CHG and inform your nurse when you arrive at Short Stay. Do not shave (including legs and underarms) for at least 48 hours prior to the first CHG shower.    Please follow these instructions carefully:  1.  Shower with CHG Soap the night before surgery and the  morning of Surgery.  2.  If you choose to wash your hair, wash your hair first as usual with your  normal  shampoo.  3.  After  you shampoo, rinse your hair and body thoroughly to remove the  shampoo.                                        4.  Use CHG as you would any other liquid soap.  You can apply chg directly  to the skin and wash                       Gently with a scrungie or clean washcloth.  5.  Apply the CHG Soap to your body ONLY FROM THE NECK DOWN.   Do not use on face/ open                           Wound or open sores. Avoid contact with eyes, ears mouth and genitals (private parts).                       Wash face,  Genitals (private parts) with your normal soap.             6.  Wash thoroughly, paying special attention to the area where your surgery  will be performed.  7.  Thoroughly rinse your body with warm water from the neck down.  8.  DO NOT shower/wash with your normal soap after using and rinsing off  the CHG Soap.             9.  Pat yourself dry with a clean towel.            10.  Wear clean pajamas.            11.  Place clean sheets on your bed the night of your first shower and do not  sleep with pets. Day of Surgery : Do not apply any lotions/deodorants the morning of surgery.  Please wear clean clothes to the hospital/surgery center.  FAILURE  TO FOLLOW THESE INSTRUCTIONS MAY RESULT IN THE CANCELLATION OF YOUR SURGERY PATIENT SIGNATURE_________________________________  NURSE SIGNATURE__________________________________  ________________________________________________________________________

## 2021-03-09 ENCOUNTER — Other Ambulatory Visit: Payer: Self-pay

## 2021-03-09 ENCOUNTER — Encounter (HOSPITAL_COMMUNITY): Payer: Self-pay

## 2021-03-09 ENCOUNTER — Encounter (HOSPITAL_COMMUNITY)
Admission: RE | Admit: 2021-03-09 | Discharge: 2021-03-09 | Disposition: A | Discharge status: Home / Self Care | Payer: BC Managed Care – PPO | Source: Ambulatory Visit | Attending: Surgery | Admitting: Surgery

## 2021-03-09 NOTE — Progress Notes (Signed)
COVID Vaccine Completed:no Date COVID Vaccine completed: COVID vaccine manufacturer: [place]   PCP - Marda Stalker PA Cardiologist - no  Chest x-ray - no EKG - no Stress Test - no ECHO - no Cardiac Cath - no Pacemaker/ICD device last checked:NA  Sleep Study - No CPAP -   Fasting Blood Sugar - NA Checks Blood Sugar _____ times a day  Blood Thinner Instructions:NA Aspirin Instructions: Last Dose:  Anesthesia review:   Patient denies shortness of breath, fever, cough and chest pain at PAT appointment  Yes Pt is able to climb stairs, do housework and ADLs with out any SOB  Patient verbalized understanding of instructions that were given to them at the PAT appointment. Patient was also instructed that they will need to review over the PAT instructions again at home before surgery.yes

## 2021-03-16 ENCOUNTER — Other Ambulatory Visit (HOSPITAL_COMMUNITY): Payer: BC Managed Care – PPO

## 2021-03-19 ENCOUNTER — Encounter (HOSPITAL_COMMUNITY): Payer: Self-pay | Admitting: Surgery

## 2021-03-19 NOTE — H&P (Signed)
General Surgery [facility]  Nitzia Perren DOB: 08-Sep-1966 Single / Language: Cleophus Molt / Race: Refused to Report/Unreported Female   History of Present Illness   The patient is a 55 year old female who presents with primary hyperparathyroidism.  CHIEF COMPLAINT: primary hyperparathyroidism  Patient is referred by Dr. Vivia Ewing for surgical evaluation and management of newly diagnosed primary hyperparathyroidism. Patient's primary care provider is Marda Stalker. Patient has a history of hyperthyroidism treated with radioactive iodine ablation about 27 years ago in [place]. She has been on thyroid hormone replacement since that time. Patient was noted on routine laboratory studies to have elevated serum calcium levels approximately 1-2 years ago. Repeated levels remained elevated. Most recent levels have ranged from 10.7-10.9. Patient intact PTH level was elevated at 96. 24-hour urine collection for calcium was markedly elevated at 584. 25-hydroxy vitamin D level was 30. Patient has not experienced any significant complications. She has not had a bone density scan. She denies any recent fractures. She denies bone or joint pain. She denies nephrolithiasis. She denies fatigue. Patient has not had any further imaging studies performed. 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   Allergies  Erythromycin *DERMATOLOGICALS*  Allergies Reconciled   Medication History  Tylenol (500MG  Capsule, 1 (one) Oral) Active. Synthroid (75MCG Tablet, Oral) Active. Cholecalciferol (Oral) Specific strength unknown - Active. Medications Reconciled  Vitals  Weight: 153.25 lb Height: 63in Body Surface Area: 1.73 m Body Mass Index: 27.15 kg/m  Temp.: 97.21F  Pulse: 108 (Regular)  P.OX: 97% (Room air) BP: 120/80(Sitting, Left Arm, Standard)  Physical Exam  The physical exam findings are as follows: Note: GENERAL APPEARANCE Development:  normal Nutritional status: normal Gross deformities: none  SKIN Rash, lesions, ulcers: none Induration, erythema: none Nodules: none palpable  EYES Conjunctiva and lids: normal Pupils: equal and reactive Iris: normal bilaterally  EARS, NOSE, MOUTH, THROAT External ears: no lesion or deformity External nose: no lesion or deformity Hearing: grossly normal Due to Covid-19 pandemic, patient is wearing a mask.  NECK Symmetric: yes Trachea: midline Thyroid: no palpable nodules in the thyroid bed  CHEST Respiratory effort: normal Retraction or accessory muscle use: no Breath sounds: normal bilaterally Rales, rhonchi, wheeze: none  CARDIOVASCULAR Auscultation: regular rhythm, normal rate Murmurs: none Pulses: radial pulse 2+ palpable Lower extremity edema: none  MUSCULOSKELETAL Station and gait: normal Digits and nails: no clubbing or cyanosis Muscle strength: grossly normal all extremities Range of motion: grossly normal all extremities Deformity: none  LYMPHATIC Cervical: none palpable Supraclavicular: none palpable  PSYCHIATRIC Oriented to person, place, and time: yes Mood and affect: normal for situation Judgment and insight: appropriate for situation    Assessment & Plan  PRIMARY HYPERPARATHYROIDISM (E21.0)  Follow Up - Call [facility] office after tests / studies doneto discuss further plans  Patient is referred by her endocrinologist for surgical evaluation and management of primary hyperparathyroidism.  Patient provided with a copy of "Parathyroid Surgery: Treatment for Your Parathyroid Gland Problem", published by [employer], 12 pages. Book reviewed and explained to patient during visit today.  Patient has biochemical evidence of primary hyperparathyroidism. We discussed proceeding with further imaging studies in order to confirm the diagnosis and to localize the site of the parathyroid adenoma. If this is successful, then she will be a good candidate for  minimally invasive surgery. We will request a nuclear medicine parathyroid scan as well as an ultrasound examination of the neck. The studies are about 80% successful in identifying the location of the adenoma.  If they fail to identify the adenoma, then we will proceed with a 4D CT scan of the neck. We discussed minimally invasive parathyroid surgery. We discussed the location of the surgical incision. We discussed the hospital stay to be anticipated. We discussed potential complications. The patient understands and agrees to proceed with further testing.  Patient will undergo the above studies. We will contact her with those results and then make a decision on how to proceed.  ADDENDUM  Telephone call to patient regarding 4D-CT results. Positive for left inferior parathyroid adenoma corresponding to the nodule seen on the USN exam.  Recommend proceeding with minimally invasive parathyroidectomy as an out-patient procedure.  The risks and benefits of the procedure have been discussed at length with the patient.  The patient understands the proposed procedure, potential alternative treatments, and the course of recovery to be expected.  All of the patient's questions have been answered at this time.  The patient wishes to proceed with surgery.  Armandina Gemma, [facility] Office: [phone]

## 2021-03-19 NOTE — Anesthesia Preprocedure Evaluation (Addendum)
Anesthesia Evaluation  Patient identified by MRN, date of birth, ID band Patient awake    Reviewed: Allergy & Precautions, NPO status , Patient's Chart, lab work & pertinent test results  History of Anesthesia Complications Negative for: history of anesthetic complications  Airway Mallampati: II  TM Distance: >3 FB Neck ROM: Full    Dental no notable dental hx. (+) Dental Advisory Given   Pulmonary neg pulmonary ROS,    Pulmonary exam normal        Cardiovascular negative cardio ROS Normal cardiovascular exam     Neuro/Psych negative neurological ROS     GI/Hepatic negative GI ROS, Neg liver ROS,   Endo/Other  Hypothyroidism   Renal/GU negative Renal ROS     Musculoskeletal negative musculoskeletal ROS (+)   Abdominal   Peds  Hematology negative hematology ROS (+)   Anesthesia Other Findings   Reproductive/Obstetrics                            Anesthesia Physical Anesthesia Plan  ASA: II  Anesthesia Plan: General   Post-op Pain Management:    Induction: Intravenous  PONV Risk Score and Plan: 4 or greater and Ondansetron, Dexamethasone, Midazolam and Scopolamine patch - Pre-op  Airway Management Planned: Oral ETT  Additional Equipment:   Intra-op Plan:   Post-operative Plan: Extubation in OR  Informed Consent: I have reviewed the patients History and Physical, chart, labs and discussed the procedure including the risks, benefits and alternatives for the proposed anesthesia with the patient or authorized representative who has indicated his/her understanding and acceptance.     Dental advisory given  Plan Discussed with: Anesthesiologist and CRNA  Anesthesia Plan Comments:        Anesthesia Quick Evaluation

## 2021-03-20 ENCOUNTER — Ambulatory Visit (HOSPITAL_COMMUNITY): Payer: BC Managed Care – PPO | Admitting: Anesthesiology

## 2021-03-20 ENCOUNTER — Encounter (HOSPITAL_COMMUNITY)
Admission: RE | Disposition: A | Discharge status: Home / Self Care | Payer: Self-pay | Source: Home / Self Care | Attending: Surgery

## 2021-03-20 ENCOUNTER — Encounter (HOSPITAL_COMMUNITY): Payer: Self-pay | Admitting: Surgery

## 2021-03-20 ENCOUNTER — Ambulatory Visit (HOSPITAL_COMMUNITY)
Admission: RE | Admit: 2021-03-20 | Discharge: 2021-03-20 | Disposition: A | Discharge status: Home / Self Care | Payer: BC Managed Care – PPO | Attending: Surgery | Admitting: Surgery

## 2021-03-20 DIAGNOSIS — Z881 Allergy status to other antibiotic agents status: Secondary | ICD-10-CM | POA: Diagnosis not present

## 2021-03-20 DIAGNOSIS — Z79890 Hormone replacement therapy (postmenopausal): Secondary | ICD-10-CM | POA: Diagnosis not present

## 2021-03-20 DIAGNOSIS — E210 Primary hyperparathyroidism: Secondary | ICD-10-CM | POA: Insufficient documentation

## 2021-03-20 DIAGNOSIS — Z79899 Other long term (current) drug therapy: Secondary | ICD-10-CM | POA: Insufficient documentation

## 2021-03-20 DIAGNOSIS — E213 Hyperparathyroidism, unspecified: Secondary | ICD-10-CM | POA: Diagnosis present

## 2021-03-20 DIAGNOSIS — D351 Benign neoplasm of parathyroid gland: Secondary | ICD-10-CM | POA: Insufficient documentation

## 2021-03-20 HISTORY — PX: PARATHYROIDECTOMY: SHX19

## 2021-03-20 LAB — BASIC METABOLIC PANEL
Anion gap: 6 (ref 5–15)
BUN: 17 mg/dL (ref 6–20)
CO2: 29 mmol/L (ref 22–32)
Calcium: 11 mg/dL — ABNORMAL HIGH (ref 8.9–10.3)
Chloride: 108 mmol/L (ref 98–111)
Creatinine, Ser: 0.62 mg/dL (ref 0.44–1.00)
GFR, Estimated: >60 mL/min (ref >60)
Glucose, Bld: 98 mg/dL (ref 70–99)
Potassium: 3.6 mmol/L (ref 3.5–5.1)
Sodium: 143 mmol/L (ref 135–145)

## 2021-03-20 LAB — CBC WITH DIFFERENTIAL/PLATELET
Abs Immature Granulocytes: 0.02 10*3/uL (ref 0.00–0.07)
Basophils Absolute: 0 10*3/uL (ref 0.0–0.1)
Basophils Relative: 1 %
Eosinophils Absolute: 0.1 10*3/uL (ref 0.0–0.5)
Eosinophils Relative: 1 %
HCT: 43.6 % (ref 36.0–46.0)
Hemoglobin: 14.7 g/dL (ref 12.0–15.0)
Immature Granulocytes: 0 %
Lymphocytes Relative: 31 %
Lymphs Abs: 1.9 10*3/uL (ref 0.7–4.0)
MCH: 32.4 pg (ref 26.0–34.0)
MCHC: 33.7 g/dL (ref 30.0–36.0)
MCV: 96 fL (ref 80.0–100.0)
Monocytes Absolute: 0.5 10*3/uL (ref 0.1–1.0)
Monocytes Relative: 8 %
Neutro Abs: 3.8 10*3/uL (ref 1.7–7.7)
Neutrophils Relative %: 59 %
Platelets: 204 10*3/uL (ref 150–400)
RBC: 4.54 MIL/uL (ref 3.87–5.11)
RDW: 12.5 % (ref 11.5–15.5)
WBC: 6.3 10*3/uL (ref 4.0–10.5)
nRBC: 0 % (ref 0.0–0.2)

## 2021-03-20 SURGERY — PARATHYROIDECTOMY
Anesthesia: General | Site: Neck | Laterality: Left

## 2021-03-20 MED ORDER — ROCURONIUM BROMIDE 10 MG/ML (PF) SYRINGE
PREFILLED_SYRINGE | INTRAVENOUS | Status: AC
  Filled 2021-03-20: qty 10

## 2021-03-20 MED ORDER — MIDAZOLAM HCL 2 MG/2ML IJ SOLN
INTRAMUSCULAR | Status: AC
  Filled 2021-03-20: qty 2

## 2021-03-20 MED ORDER — PROPOFOL 10 MG/ML IV BOLUS
INTRAVENOUS | Status: DC | PRN
  Administered 2021-03-20: 180 mg via INTRAVENOUS

## 2021-03-20 MED ORDER — BUPIVACAINE HCL 0.25 % IJ SOLN
INTRAMUSCULAR | Status: DC | PRN
  Administered 2021-03-20: 9 mL

## 2021-03-20 MED ORDER — ACETAMINOPHEN 500 MG PO TABS
1000.0000 mg | ORAL_TABLET | Freq: Once | ORAL | Status: AC
  Administered 2021-03-20: 1000 mg via ORAL
  Filled 2021-03-20: qty 2

## 2021-03-20 MED ORDER — FENTANYL CITRATE (PF) 100 MCG/2ML IJ SOLN
INTRAMUSCULAR | Status: AC
  Filled 2021-03-20: qty 2

## 2021-03-20 MED ORDER — TRAMADOL HCL 50 MG PO TABS: 50.0000 mg | ORAL_TABLET | Freq: Four times a day (QID) | ORAL | 0 refills | Status: AC | PRN

## 2021-03-20 MED ORDER — ONDANSETRON HCL 4 MG/2ML IJ SOLN
INTRAMUSCULAR | Status: AC
  Filled 2021-03-20: qty 2

## 2021-03-20 MED ORDER — BUPIVACAINE HCL 0.25 % IJ SOLN
INTRAMUSCULAR | Status: AC
  Filled 2021-03-20: qty 1

## 2021-03-20 MED ORDER — ORAL CARE MOUTH RINSE: 15.0000 mL | Freq: Once | OROMUCOSAL | Status: AC

## 2021-03-20 MED ORDER — PROPOFOL 10 MG/ML IV BOLUS
INTRAVENOUS | Status: AC
  Filled 2021-03-20: qty 20

## 2021-03-20 MED ORDER — SUGAMMADEX SODIUM 200 MG/2ML IV SOLN
INTRAVENOUS | Status: DC | PRN
  Administered 2021-03-20: 200 mg via INTRAVENOUS

## 2021-03-20 MED ORDER — ONDANSETRON HCL 4 MG/2ML IJ SOLN
INTRAMUSCULAR | Status: DC | PRN
  Administered 2021-03-20: 4 mg via INTRAVENOUS

## 2021-03-20 MED ORDER — CHLORHEXIDINE GLUCONATE 0.12 % MT SOLN
15.0000 mL | Freq: Once | OROMUCOSAL | Status: AC
Start: 2021-03-20 — End: 2021-03-20
  Administered 2021-03-20: 15 mL via OROMUCOSAL

## 2021-03-20 MED ORDER — FENTANYL CITRATE (PF) 100 MCG/2ML IJ SOLN
INTRAMUSCULAR | Status: DC | PRN
  Administered 2021-03-20 (×2): 50 ug via INTRAVENOUS

## 2021-03-20 MED ORDER — LACTATED RINGERS IV SOLN: INTRAVENOUS | Status: DC

## 2021-03-20 MED ORDER — SCOPOLAMINE 1 MG/3DAYS TD PT72
1.0000 | MEDICATED_PATCH | TRANSDERMAL | Status: DC
  Administered 2021-03-20: 1.5 mg via TRANSDERMAL
  Filled 2021-03-20: qty 1

## 2021-03-20 MED ORDER — FENTANYL CITRATE (PF) 100 MCG/2ML IJ SOLN
25.0000 ug | INTRAMUSCULAR | Status: DC | PRN
  Administered 2021-03-20 (×2): 50 ug via INTRAVENOUS

## 2021-03-20 MED ORDER — MIDAZOLAM HCL 5 MG/5ML IJ SOLN
INTRAMUSCULAR | Status: DC | PRN
  Administered 2021-03-20: 2 mg via INTRAVENOUS

## 2021-03-20 MED ORDER — CEFAZOLIN SODIUM-DEXTROSE 2-4 GM/100ML-% IV SOLN
INTRAVENOUS | Status: AC
  Filled 2021-03-20: qty 100

## 2021-03-20 MED ORDER — 0.9 % SODIUM CHLORIDE (POUR BTL) OPTIME
TOPICAL | Status: DC | PRN
  Administered 2021-03-20: 1000 mL

## 2021-03-20 MED ORDER — CEFAZOLIN SODIUM-DEXTROSE 2-3 GM-%(50ML) IV SOLR
INTRAVENOUS | Status: DC | PRN
  Administered 2021-03-20: 2 g via INTRAVENOUS

## 2021-03-20 MED ORDER — PHENYLEPHRINE HCL (PRESSORS) 10 MG/ML IV SOLN
INTRAVENOUS | Status: AC
  Filled 2021-03-20: qty 2

## 2021-03-20 MED ORDER — PHENYLEPHRINE 40 MCG/ML (10ML) SYRINGE FOR IV PUSH (FOR BLOOD PRESSURE SUPPORT)
PREFILLED_SYRINGE | INTRAVENOUS | Status: DC | PRN
  Administered 2021-03-20 (×3): 80 ug via INTRAVENOUS

## 2021-03-20 MED ORDER — LIDOCAINE 2% (20 MG/ML) 5 ML SYRINGE
INTRAMUSCULAR | Status: DC | PRN
  Administered 2021-03-20: 60 mg via INTRAVENOUS

## 2021-03-20 MED ORDER — CELECOXIB 200 MG PO CAPS
200.0000 mg | ORAL_CAPSULE | Freq: Once | ORAL | Status: AC
  Administered 2021-03-20: 200 mg via ORAL
  Filled 2021-03-20: qty 1

## 2021-03-20 MED ORDER — DEXAMETHASONE SODIUM PHOSPHATE 10 MG/ML IJ SOLN
INTRAMUSCULAR | Status: AC
  Filled 2021-03-20: qty 1

## 2021-03-20 MED ORDER — PHENYLEPHRINE 40 MCG/ML (10ML) SYRINGE FOR IV PUSH (FOR BLOOD PRESSURE SUPPORT)
PREFILLED_SYRINGE | INTRAVENOUS | Status: AC
  Filled 2021-03-20: qty 10

## 2021-03-20 MED ORDER — ROCURONIUM BROMIDE 10 MG/ML (PF) SYRINGE
PREFILLED_SYRINGE | INTRAVENOUS | Status: DC | PRN
  Administered 2021-03-20: 80 mg via INTRAVENOUS

## 2021-03-20 MED ORDER — DEXAMETHASONE SODIUM PHOSPHATE 10 MG/ML IJ SOLN
INTRAMUSCULAR | Status: DC | PRN
  Administered 2021-03-20: 8 mg via INTRAVENOUS

## 2021-03-20 SURGICAL SUPPLY — 30 items
ATTRACTOMAT 16X20 MAGNETIC DRP (DRAPES) ×2 IMPLANT
BLADE SURG 15 STRL LF DISP TIS (BLADE) ×1 IMPLANT
BLADE SURG 15 STRL SS (BLADE) ×1
CHLORAPREP W/TINT 26 (MISCELLANEOUS) ×2 IMPLANT
CLIP VESOCCLUDE MED 6/CT (CLIP) ×4 IMPLANT
CLIP VESOCCLUDE SM WIDE 6/CT (CLIP) ×6 IMPLANT
COVER SURGICAL LIGHT HANDLE (MISCELLANEOUS) ×2 IMPLANT
COVER WAND RF STERILE (DRAPES) ×2 IMPLANT
DERMABOND ADVANCED (GAUZE/BANDAGES/DRESSINGS) ×1
DERMABOND ADVANCED .7 DNX12 (GAUZE/BANDAGES/DRESSINGS) ×1 IMPLANT
DRAPE LAPAROTOMY T 98X78 PEDS (DRAPES) ×2 IMPLANT
DRAPE UTILITY XL STRL (DRAPES) ×2 IMPLANT
ELECT REM PT RETURN 15FT ADLT (MISCELLANEOUS) ×2 IMPLANT
GAUZE 4X4 16PLY RFD (DISPOSABLE) ×2 IMPLANT
GLOVE SURG ORTHO LTX SZ8 (GLOVE) ×2 IMPLANT
GOWN STRL REUS W/TWL XL LVL3 (GOWN DISPOSABLE) ×6 IMPLANT
HEMOSTAT SURGICEL 2X4 FIBR (HEMOSTASIS) ×2 IMPLANT
ILLUMINATOR WAVEGUIDE N/F (MISCELLANEOUS) IMPLANT
KIT BASIN OR (CUSTOM PROCEDURE TRAY) ×2 IMPLANT
KIT TURNOVER KIT A (KITS) ×2 IMPLANT
NEEDLE HYPO 25X1 1.5 SAFETY (NEEDLE) ×2 IMPLANT
PACK BASIC VI WITH GOWN DISP (CUSTOM PROCEDURE TRAY) ×2 IMPLANT
PENCIL SMOKE EVACUATOR (MISCELLANEOUS) ×2 IMPLANT
SUT MNCRL AB 4-0 PS2 18 (SUTURE) ×2 IMPLANT
SUT VIC AB 3-0 SH 18 (SUTURE) ×2 IMPLANT
SYR BULB IRRIG 60ML STRL (SYRINGE) ×2 IMPLANT
SYR CONTROL 10ML LL (SYRINGE) ×2 IMPLANT
TOWEL OR 17X26 10 PK STRL BLUE (TOWEL DISPOSABLE) ×2 IMPLANT
TOWEL OR NON WOVEN STRL DISP B (DISPOSABLE) ×2 IMPLANT
TUBING CONNECTING 10 (TUBING) ×2 IMPLANT

## 2021-03-20 NOTE — Interval H&P Note (Signed)
History and Physical Interval Note:  03/20/2021 7:00 AM  Katelyn Montgomery  has presented today for surgery, with the diagnosis of PRIMARY HYPERPARATHYROIDISM.  The various methods of treatment have been discussed with the patient and family. After consideration of risks, benefits and other options for treatment, the patient has consented to    Procedure(s): LEFT INFERIOR PARATHYROIDECTOMY (Left) as a surgical intervention.    The patient's history has been reviewed, patient examined, no change in status, stable for surgery.  I have reviewed the patient's chart and labs.  Questions were answered to the patient's satisfaction.    Armandina Gemma, MD [facility] Office: [place]

## 2021-03-20 NOTE — Anesthesia Procedure Notes (Signed)
Procedure Name: Intubation Date/Time: 03/20/2021 7:26 AM Performed by: Victoriano Lain, CRNA Pre-anesthesia Checklist: Patient identified, Emergency Drugs available, Suction available, Patient being monitored and Timeout performed Patient Re-evaluated:Patient Re-evaluated prior to induction Oxygen Delivery Method: Circle system utilized Preoxygenation: Pre-oxygenation with 100% oxygen Induction Type: IV induction Ventilation: Mask ventilation without difficulty Laryngoscope Size: Mac and 4 Grade View: Grade I Tube type: Oral Tube size: 7.5 mm Number of attempts: 1 Airway Equipment and Method: Stylet Placement Confirmation: ETT inserted through vocal cords under direct vision,  positive ETCO2 and breath sounds checked- equal and bilateral Secured at: 21 cm Tube secured with: Tape Dental Injury: Teeth and Oropharynx as per pre-operative assessment

## 2021-03-20 NOTE — Op Note (Signed)
OPERATIVE REPORT - PARATHYROIDECTOMY  Preoperative diagnosis: Primary hyperparathyroidism  Postop diagnosis: Same  Procedure: Left inferior minimally invasive parathyroidectomy  Surgeon:  Armandina Gemma, MD  Anesthesia: General endotracheal  Estimated blood loss: Minimal  Preparation: ChloraPrep  Indications: Patient is referred by Dr. Vivia Ewing for surgical evaluation and management of newly diagnosed primary hyperparathyroidism. Patient's primary care provider is Marda Stalker. Patient has a history of hyperthyroidism treated with radioactive iodine ablation about 27 years ago in [place]. She has been on thyroid hormone replacement since that time. Patient was noted on routine laboratory studies to have elevated serum calcium levels approximately 1-2 years ago. Repeated levels remained elevated. Most recent levels have ranged from 10.7-10.9. Patient intact PTH level was elevated at 96. 24-hour urine collection for calcium was markedly elevated at 584. 25-hydroxy vitamin D level was 30. Nuclear medicine parathyroid scan and USN localized a left inferior adenoma.  Patient now comes to surgery for parathyroidectomy.  Procedure: The patient was prepared in the pre-operative holding area. The patient was brought to the operating room and placed in a supine position on the operating room table. Following administration of general anesthesia, the patient was positioned and then prepped and draped in the usual strict aseptic fashion. After ascertaining that an adequate level of anesthesia been achieved, a neck incision was made with a #15 blade. Dissection was carried through subcutaneous tissues and platysma. Hemostasis was obtained with the electrocautery. Skin flaps were developed circumferentially and a Weitlander retractor was placed for exposure.  Strap muscles were incised in the midline. Strap muscles were reflected laterally exposing the thyroid lobe. With gentle blunt  dissection the thyroid lobe was mobilized.  Dissection was carried through adipose tissue and an enlarged parathyroid gland was identified. It was gently mobilized. Vascular structures were divided between small ligaclips. Care was taken to avoid the recurrent laryngeal nerve and the esophagus. The parathyroid gland was completely excised. It was submitted to pathology where frozen section confirmed parathyroid tissue consistent with adenoma.  Neck was irrigated with warm saline and good hemostasis was noted. Fibrillar was placed in the operative field. Strap muscles were approximated in the midline with interrupted 3-0 Vicryl sutures. Platysma was closed with interrupted 3-0 Vicryl sutures. Marcaine was infiltrated circumferentially. Skin was closed with a running 4-0 Monocryl subcuticular suture. Wound was washed and dried and Dermabond was applied. Patient was awakened from anesthesia and brought to the recovery room. The patient tolerated the procedure well.   Armandina Gemma, MD [facility] Office: [phone]

## 2021-03-20 NOTE — Anesthesia Postprocedure Evaluation (Signed)
Anesthesia Post Note  Patient: Katelyn Montgomery  Procedure(s) Performed: LEFT INFERIOR PARATHYROIDECTOMY (Left Neck)     Patient location during evaluation: PACU Anesthesia Type: General Level of consciousness: sedated Pain management: pain level controlled Vital Signs Assessment: post-procedure vital signs reviewed and stable Respiratory status: spontaneous breathing and respiratory function stable Cardiovascular status: stable Postop Assessment: no apparent nausea or vomiting Anesthetic complications: no   No complications documented.  Last Vitals:  Vitals:   03/20/21 0915 03/20/21 0933  BP: 138/87 (!) 150/89  Pulse: 74 (!) 58  Resp: 10 16  Temp: (!) 36.1 C   SpO2: 99% 95%    Last Pain:  Vitals:   03/20/21 0933  TempSrc:   PainSc: 3                  Judy Goodenow DANIEL

## 2021-03-20 NOTE — Transfer of Care (Signed)
Immediate Anesthesia Transfer of Care Note  Patient: Lean Jaeger  Procedure(s) Performed: LEFT INFERIOR PARATHYROIDECTOMY (Left Neck)  Patient Location: PACU  Anesthesia Type:General  Level of Consciousness: awake, alert , oriented and patient cooperative  Airway & Oxygen Therapy: Patient Spontanous Breathing and Patient connected to face mask oxygen  Post-op Assessment: Report given to RN, Post -op Vital signs reviewed and stable and Patient moving all extremities  Post vital signs: Reviewed and stable  Last Vitals:  Vitals Value Taken Time  BP 157/97 03/20/21 0825  Temp    Pulse 78 03/20/21 0826  Resp 17 03/20/21 0826  SpO2 100 % 03/20/21 0826  Vitals shown include unvalidated device data.  Last Pain:  Vitals:   03/20/21 0539  TempSrc:   PainSc: 0-No pain         Complications: No complications documented.

## 2021-03-21 ENCOUNTER — Encounter (HOSPITAL_COMMUNITY): Payer: Self-pay | Admitting: Surgery

## 2021-03-21 LAB — SURGICAL PATHOLOGY

## 2021-06-27 IMAGING — CT CT NECK SOFT TISSUE WO/W CM
2 of 7 series · 6 of 20 positions shown, 7 images · IV contrast (APPLIED)
Comparison: Nuclear medicine sestamibi scan 11/07/2020. Thyroid
ultrasound 11/07/2020. Cervical spine CT 12/09/2019.

CLINICAL DATA: 54-year-old female with hyperparathyroidism.
Unrevealing nuclear medicine parathyroid scan last month.

EXAM:
CT NECK WITH AND WITHOUT CONTRAST
TECHNIQUE: Multidetector CT imaging of the neck was performed without and with
intravenous contrast.
CONTRAST:  100mL 0EXCD0-0VV IOPAMIDOL (0EXCD0-0VV) INJECTION 61%

[Series 5: (id) · axial · 0.34mm/px · z∈[-83,+113]mm · 3 of 197 slices shown, 4 images]
[im 1/197  soft-tissue]
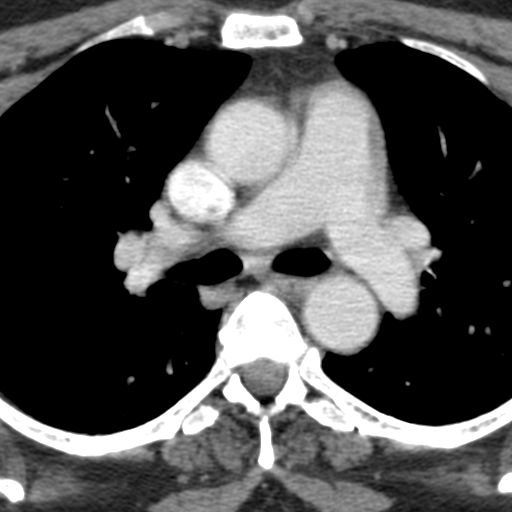
[im 1/197  bone]
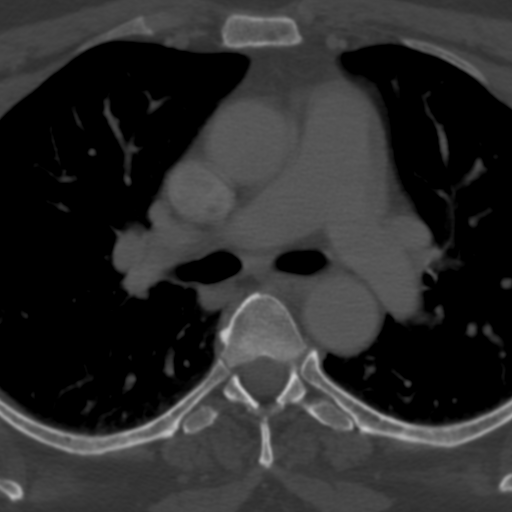
[im 99/197  bone]
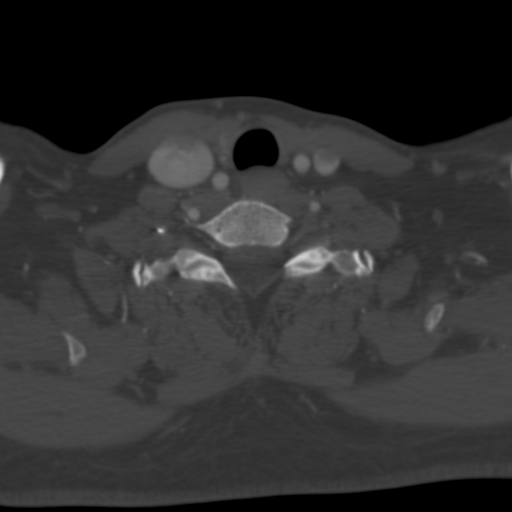
[im 197/197  bone]
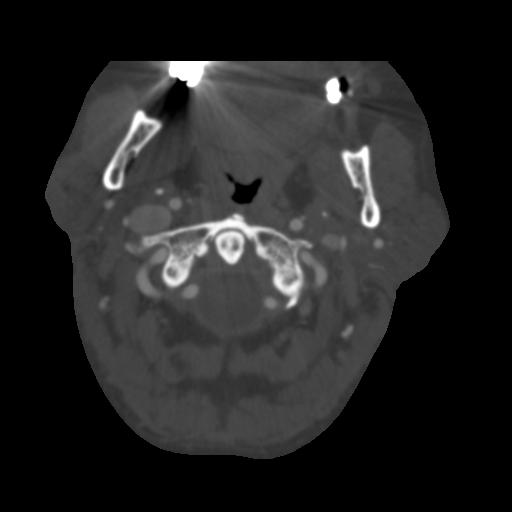

[Series 12: neck-venous-cor 2mm · coronal · portal-venous · 0.35mm/px · 3 of 88 slices shown]
[im 18/88  bone]
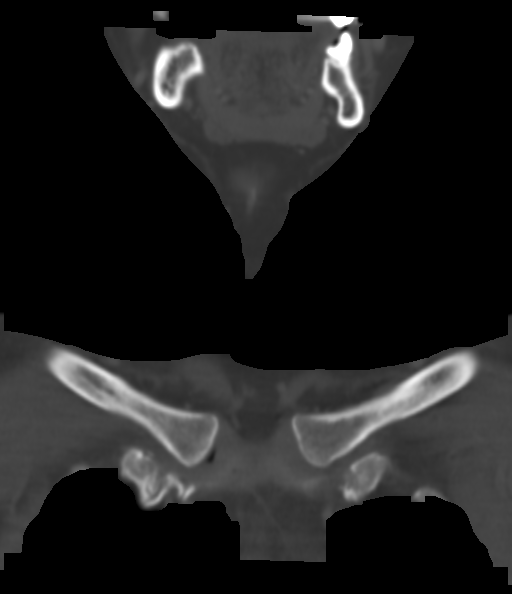
[im 35/88  bone]
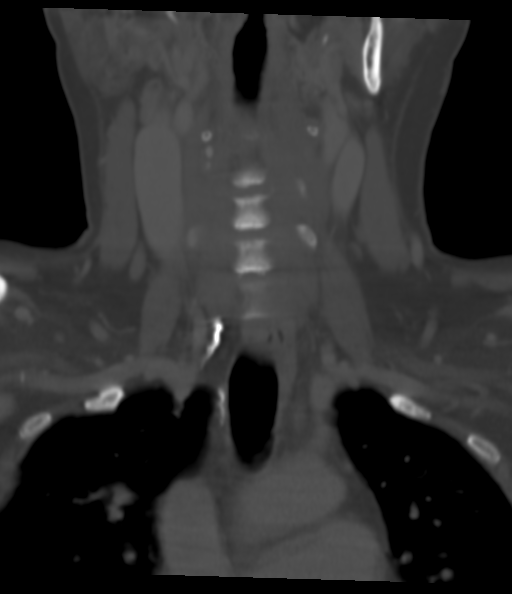
[im 53/88  bone]
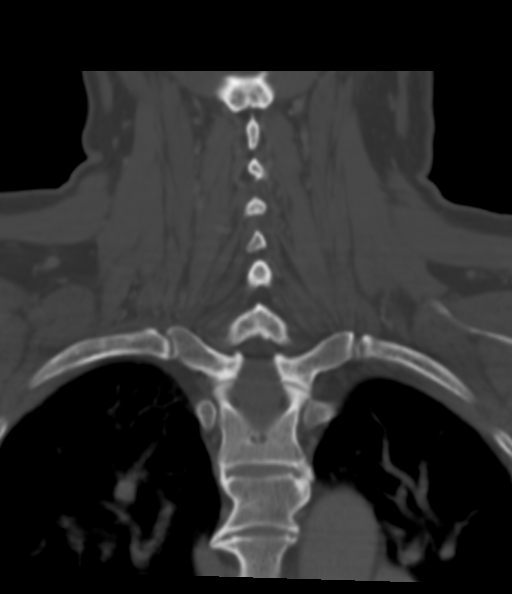

[6 of 20 positions shown; findings below may reference images not displayed]

FINDINGS: Pharynx and larynx: Negative laryngeal and visible pharyngeal soft
tissue contours. Negative visible parapharyngeal and retropharyngeal
spaces.

Salivary glands: Negative sublingual space, submandibular glands.
Visible parotid glands are normal aside from chronic punctate right
parotid sialolithiasis.

Thyroid: Thyroid parenchyma is chronically diminutive or absent.
There is a small chronic partially calcified nodule at the level of
the right thyroid lobe (series 9, image 53), unchanged from last
year. Minimal parenchyma visible at the left lobe. Isthmus appears
to be absent.

No suspicious soft tissue nodularity about the right lobe.

However, just caudal to what little left thyroid parenchyma is
identified there is an early enhancing oval nodule measuring 7 x 9
mm (series 8, image 58 and series 10, image 27) with polar vessel(s)
- see also series 10, image 28. Enhancement fades as expected. This
is just medial to the left common carotid artery, overlying the
trachea.

No other suspicious nodularity or enhancement is identified.

Lymph nodes: Negative.  No cervical lymphadenopathy.

Vascular: Major vascular structures in the neck are patent and
appear normal. Right IJ is dominant (normal variant). Mild
tortuosity of the proximal right ICA. Right vertebral artery also
mildly dominant (normal variant).

Skeleton: Carious posterior mandible dentition bilaterally. No acute
osseous abnormality identified.

Upper chest: Negative visible upper lungs. Normal visible superior
mediastinum. No mass or lymphadenopathy. Minimal calcified
atherosclerosis of the aortic arch.

Other: None.
IMPRESSION: 1. High suspicion of a small left parathyroid adenoma measuring 7 x
9 mm and corresponding to the nodule described by ultrasound. See
series 8, image 58 and series 10, image 28.
2. Largely absent thyroid parenchyma.
3. Otherwise largely negative CT appearance of the visible neck;
punctate chronic right parotid sialolith.

## 2021-10-03 DIAGNOSIS — U071 COVID-19: Secondary | ICD-10-CM | POA: Diagnosis not present

## 2021-10-03 DIAGNOSIS — R059 Cough, unspecified: Secondary | ICD-10-CM | POA: Diagnosis not present

## 2022-02-15 DIAGNOSIS — E559 Vitamin D deficiency, unspecified: Secondary | ICD-10-CM | POA: Diagnosis not present

## 2022-02-15 DIAGNOSIS — M5441 Lumbago with sciatica, right side: Secondary | ICD-10-CM | POA: Diagnosis not present

## 2022-02-15 DIAGNOSIS — E890 Postprocedural hypothyroidism: Secondary | ICD-10-CM | POA: Diagnosis not present

## 2022-02-22 DIAGNOSIS — E559 Vitamin D deficiency, unspecified: Secondary | ICD-10-CM | POA: Diagnosis not present

## 2022-02-22 DIAGNOSIS — E890 Postprocedural hypothyroidism: Secondary | ICD-10-CM | POA: Diagnosis not present

## 2022-02-22 DIAGNOSIS — M542 Cervicalgia: Secondary | ICD-10-CM | POA: Diagnosis not present

## 2022-02-22 DIAGNOSIS — Z13220 Encounter for screening for lipoid disorders: Secondary | ICD-10-CM | POA: Diagnosis not present

## 2022-02-22 DIAGNOSIS — M545 Low back pain, unspecified: Secondary | ICD-10-CM | POA: Diagnosis not present

## 2023-01-17 DIAGNOSIS — E890 Postprocedural hypothyroidism: Secondary | ICD-10-CM | POA: Diagnosis not present

## 2023-02-21 DIAGNOSIS — M5441 Lumbago with sciatica, right side: Secondary | ICD-10-CM | POA: Diagnosis not present

## 2023-02-21 DIAGNOSIS — E559 Vitamin D deficiency, unspecified: Secondary | ICD-10-CM | POA: Diagnosis not present

## 2023-02-21 DIAGNOSIS — Z13220 Encounter for screening for lipoid disorders: Secondary | ICD-10-CM | POA: Diagnosis not present

## 2023-02-21 DIAGNOSIS — E890 Postprocedural hypothyroidism: Secondary | ICD-10-CM | POA: Diagnosis not present

## 2024-11-20 ENCOUNTER — Emergency Department (HOSPITAL_COMMUNITY)
Admission: EM | Admit: 2024-11-20 | Discharge: 2024-11-20 | Disposition: A | Discharge status: Home / Self Care | Source: Home / Self Care | Attending: Emergency Medicine | Admitting: Emergency Medicine

## 2024-11-20 ENCOUNTER — Emergency Department (HOSPITAL_COMMUNITY)

## 2024-11-20 ENCOUNTER — Other Ambulatory Visit: Payer: Self-pay

## 2024-11-20 MED ORDER — ACETAMINOPHEN 500 MG PO TABS
1000.0000 mg | ORAL_TABLET | Freq: Once | ORAL | Status: AC
  Administered 2024-11-20: 1000 mg via ORAL
  Filled 2024-11-20: qty 2

## 2024-11-20 MED ORDER — LIDOCAINE 5 % EX PTCH: 1.0000 | MEDICATED_PATCH | CUTANEOUS | 0 refills | Status: AC

## 2024-11-20 MED ORDER — CYCLOBENZAPRINE HCL 10 MG PO TABS: 10.0000 mg | ORAL_TABLET | Freq: Two times a day (BID) | ORAL | 0 refills | Status: AC | PRN
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
## 2024-11-20 NOTE — ED Notes (Signed)
 The patient would like to speak with the PA-C before she is discharged about her right ear. Provider made aware.
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
## 2024-11-20 NOTE — Discharge Instructions (Addendum)
 Continue Tylenol  as needed for pain.  Use Flexeril  up to twice daily as needed for muscle spasms, please be aware this medication may cause fatigue/drowsiness.  You may also use lidocaine  patches as needed for pain.  Return to the emergency department if your symptoms worsen, follow-up with your primary care provider as needed.
